# Patient Record
Sex: Male | Born: 1980 | Race: White | Hispanic: No | Marital: Married | Smoking: Never smoker
Health system: Southern US, Community
[De-identification: ages and names within clinical notes are randomized; demographics above are authoritative.]

## PROBLEM LIST (undated history)

## (undated) DIAGNOSIS — R202 Paresthesia of skin: Secondary | ICD-10-CM

## (undated) DIAGNOSIS — K3184 Gastroparesis: Secondary | ICD-10-CM

## (undated) DIAGNOSIS — K5901 Slow transit constipation: Secondary | ICD-10-CM

## (undated) DIAGNOSIS — E291 Testicular hypofunction: Secondary | ICD-10-CM

## (undated) DIAGNOSIS — G43009 Migraine without aura, not intractable, without status migrainosus: Secondary | ICD-10-CM

## (undated) HISTORY — DX: Gastroparesis: K31.84

## (undated) HISTORY — DX: Paresthesia of skin: R20.2

## (undated) HISTORY — DX: Migraine without aura, not intractable, without status migrainosus: G43.009

## (undated) HISTORY — DX: Testicular hypofunction: E29.1

## (undated) HISTORY — DX: Slow transit constipation: K59.01

---

## 2009-07-31 ENCOUNTER — Emergency Department (HOSPITAL_COMMUNITY): Admission: EM | Admit: 2009-07-31 | Discharge: 2009-07-31 | Payer: Self-pay | Admitting: Emergency Medicine

## 2009-09-06 ENCOUNTER — Emergency Department (HOSPITAL_COMMUNITY): Admission: EM | Admit: 2009-09-06 | Discharge: 2009-09-06 | Payer: Self-pay | Admitting: Emergency Medicine

## 2010-03-22 ENCOUNTER — Encounter: Admission: RE | Admit: 2010-03-22 | Discharge: 2010-03-22 | Payer: Self-pay | Admitting: Sports Medicine

## 2010-09-13 ENCOUNTER — Emergency Department (HOSPITAL_COMMUNITY)
Admission: EM | Admit: 2010-09-13 | Discharge: 2010-09-13 | Disposition: A | Discharge status: Home / Self Care | Payer: No Typology Code available for payment source | Attending: Emergency Medicine | Admitting: Emergency Medicine

## 2010-09-13 DIAGNOSIS — G8929 Other chronic pain: Secondary | ICD-10-CM | POA: Insufficient documentation

## 2010-09-13 DIAGNOSIS — R1032 Left lower quadrant pain: Secondary | ICD-10-CM | POA: Insufficient documentation

## 2010-09-13 DIAGNOSIS — K625 Hemorrhage of anus and rectum: Secondary | ICD-10-CM | POA: Insufficient documentation

## 2010-09-13 LAB — APTT: aPTT: 28 seconds (ref 24–37)

## 2010-09-13 LAB — COMPREHENSIVE METABOLIC PANEL
ALT: 19 U/L (ref 0–53)
Alkaline Phosphatase: 64 U/L (ref 39–117)
BUN: 15 mg/dL (ref 6–23)
CO2: 28 mEq/L (ref 19–32)
Calcium: 9.5 mg/dL (ref 8.4–10.5)
Chloride: 101 mEq/L (ref 96–112)
Creatinine, Ser: 0.95 mg/dL (ref 0.4–1.5)
GFR calc non Af Amer: >60 mL/min (ref >60)
Glucose, Bld: 128 mg/dL — ABNORMAL HIGH (ref 70–99)
Potassium: 3.5 mEq/L (ref 3.5–5.1)
Sodium: 139 mEq/L (ref 135–145)
Total Bilirubin: 0.7 mg/dL (ref 0.3–1.2)

## 2010-09-13 LAB — DIFFERENTIAL
Basophils Relative: 0 % (ref 0–1)
Eosinophils Absolute: 0 10*3/uL (ref 0.0–0.7)
Eosinophils Relative: 0 % (ref 0–5)
Lymphocytes Relative: 9 % — ABNORMAL LOW (ref 12–46)
Lymphs Abs: 0.9 10*3/uL (ref 0.7–4.0)
Monocytes Absolute: 0.6 10*3/uL (ref 0.1–1.0)
Monocytes Relative: 6 % (ref 3–12)
Neutro Abs: 8.1 10*3/uL — ABNORMAL HIGH (ref 1.7–7.7)
Neutrophils Relative %: 84 % — ABNORMAL HIGH (ref 43–77)

## 2010-09-13 LAB — URINALYSIS, ROUTINE W REFLEX MICROSCOPIC
Bilirubin Urine: NEGATIVE
Glucose, UA: NEGATIVE mg/dL
Ketones, ur: NEGATIVE mg/dL
Nitrite: NEGATIVE
Protein, ur: NEGATIVE mg/dL
Specific Gravity, Urine: 1.027 (ref 1.005–1.030)
Urobilinogen, UA: 0.2 mg/dL (ref 0.0–1.0)
pH: 6 (ref 5.0–8.0)

## 2010-09-13 LAB — PROTIME-INR
INR: 1.03 (ref 0.00–1.49)
Prothrombin Time: 13.7 seconds (ref 11.6–15.2)

## 2010-09-13 LAB — CBC
Hemoglobin: 14.5 g/dL (ref 13.0–17.0)
MCH: 31 pg (ref 26.0–34.0)
MCHC: 36.1 g/dL — ABNORMAL HIGH (ref 30.0–36.0)
MCV: 85.9 fL (ref 78.0–100.0)
Platelets: 251 10*3/uL (ref 150–400)
RBC: 4.68 MIL/uL (ref 4.22–5.81)
RDW: 11.9 % (ref 11.5–15.5)

## 2011-06-01 DIAGNOSIS — R1032 Left lower quadrant pain: Secondary | ICD-10-CM | POA: Insufficient documentation

## 2015-09-24 DIAGNOSIS — H6990 Unspecified Eustachian tube disorder, unspecified ear: Secondary | ICD-10-CM | POA: Insufficient documentation

## 2015-09-24 DIAGNOSIS — H6980 Other specified disorders of Eustachian tube, unspecified ear: Secondary | ICD-10-CM | POA: Insufficient documentation

## 2016-03-18 DIAGNOSIS — J300 Vasomotor rhinitis: Secondary | ICD-10-CM | POA: Insufficient documentation

## 2016-05-12 ENCOUNTER — Other Ambulatory Visit: Payer: Self-pay | Admitting: Gastroenterology

## 2016-05-12 DIAGNOSIS — R110 Nausea: Secondary | ICD-10-CM

## 2016-05-12 DIAGNOSIS — R1013 Epigastric pain: Secondary | ICD-10-CM

## 2016-06-04 ENCOUNTER — Encounter (HOSPITAL_COMMUNITY)
Admission: RE | Admit: 2016-06-04 | Discharge: 2016-06-04 | Disposition: A | Discharge status: Home / Self Care | Payer: BLUE CROSS/BLUE SHIELD | Source: Ambulatory Visit | Attending: Gastroenterology | Admitting: Gastroenterology

## 2016-06-04 DIAGNOSIS — R1013 Epigastric pain: Secondary | ICD-10-CM

## 2016-06-04 DIAGNOSIS — R110 Nausea: Secondary | ICD-10-CM | POA: Diagnosis present

## 2016-06-04 MED ORDER — TECHNETIUM TC 99M MEBROFENIN IV KIT
5.0000 | PACK | Freq: Once | INTRAVENOUS | Status: AC | PRN
  Administered 2016-06-04: 5 via INTRAVENOUS

## 2016-06-21 LAB — HM COLONOSCOPY

## 2016-07-06 ENCOUNTER — Other Ambulatory Visit: Payer: Self-pay | Admitting: Gastroenterology

## 2016-07-06 DIAGNOSIS — R634 Abnormal weight loss: Secondary | ICD-10-CM

## 2016-07-15 ENCOUNTER — Ambulatory Visit
Admission: RE | Admit: 2016-07-15 | Discharge: 2016-07-15 | Disposition: A | Discharge status: Home / Self Care | Payer: BLUE CROSS/BLUE SHIELD | Source: Ambulatory Visit | Attending: Gastroenterology | Admitting: Gastroenterology

## 2016-07-15 DIAGNOSIS — R634 Abnormal weight loss: Secondary | ICD-10-CM

## 2016-07-15 MED ORDER — IOPAMIDOL (ISOVUE-300) INJECTION 61%
100.0000 mL | Freq: Once | INTRAVENOUS | Status: AC | PRN
  Administered 2016-07-15: 100 mL via INTRAVENOUS

## 2016-10-11 DIAGNOSIS — R634 Abnormal weight loss: Secondary | ICD-10-CM | POA: Insufficient documentation

## 2017-02-09 DIAGNOSIS — R1011 Right upper quadrant pain: Secondary | ICD-10-CM | POA: Insufficient documentation

## 2017-03-14 DIAGNOSIS — R1084 Generalized abdominal pain: Secondary | ICD-10-CM | POA: Insufficient documentation

## 2017-03-21 DIAGNOSIS — K3184 Gastroparesis: Secondary | ICD-10-CM | POA: Insufficient documentation

## 2017-06-07 ENCOUNTER — Other Ambulatory Visit: Payer: Self-pay | Admitting: Gastroenterology

## 2017-06-07 DIAGNOSIS — R935 Abnormal findings on diagnostic imaging of other abdominal regions, including retroperitoneum: Secondary | ICD-10-CM

## 2017-08-17 IMAGING — CT CT ABD-PELV W/ CM
1 of 2 series · 15 of 32 positions shown, 19 images · IV contrast (APPLIED)
Comparison: 10/07/2008

CLINICAL DATA: Weight loss of 20 pounds in 3 months, no appetite
for 2 months, constipation, nausea, vomiting

EXAM:
CT ABDOMEN AND PELVIS WITH CONTRAST
TECHNIQUE: Multidetector CT imaging of the abdomen and pelvis was performed
using the standard protocol following bolus administration of
intravenous contrast. Sagittal and coronal MPR images reconstructed
from axial data set.
CONTRAST:  100mL [id number] IOPAMIDOL ([id number]) INJECTION 61% IV.
Dilute oral contrast.

[Series 2: abd/pelvis w/cm · axial · 0.67mm/px · z∈[+715,+1140]mm · 15 of 93 slices shown, 19 images]
[im 4/93  soft-tissue]
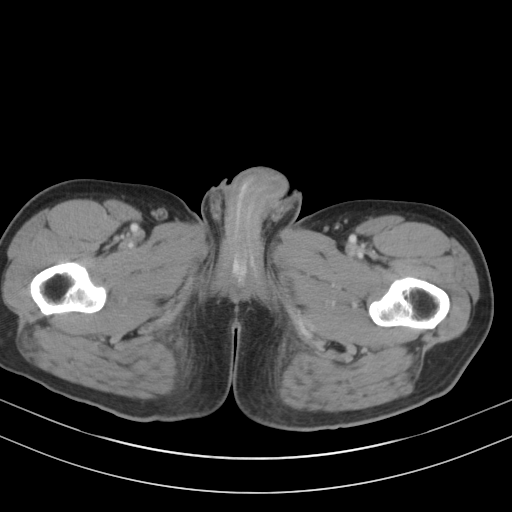
[im 4/93  bone]
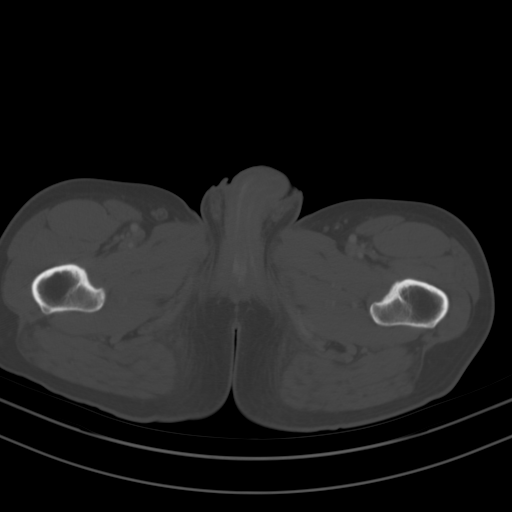
[im 12/93  soft-tissue]
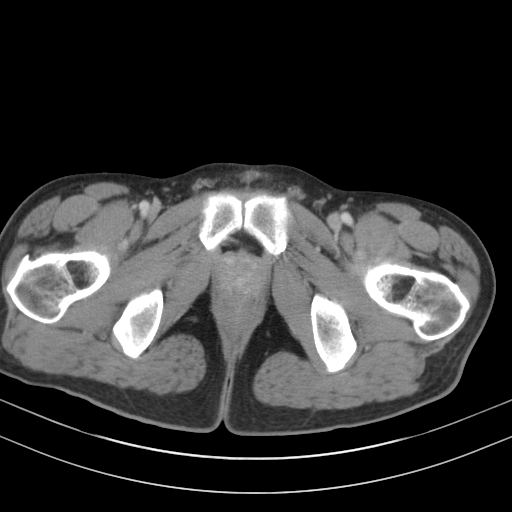
[im 20/93  soft-tissue]
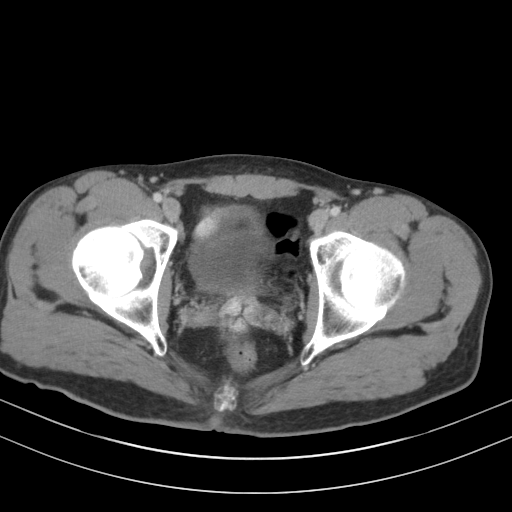
[im 27/93  soft-tissue]
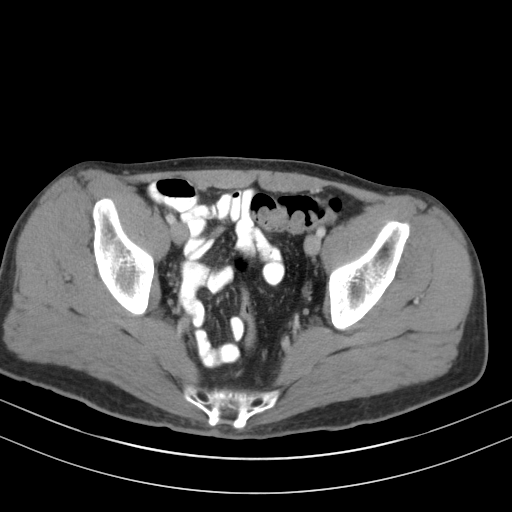
[im 31/93  soft-tissue]
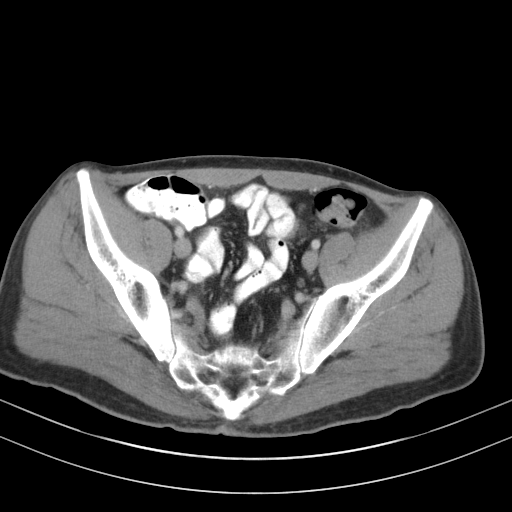
[im 39/93  soft-tissue]
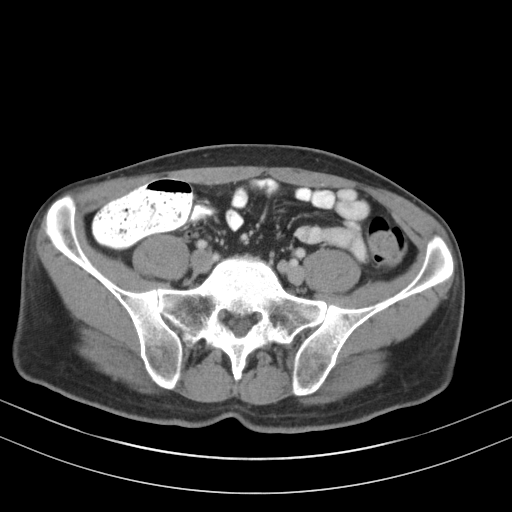
[im 47/93  soft-tissue]
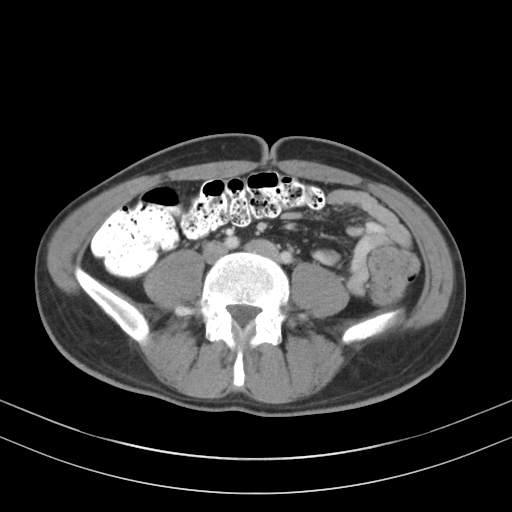
[im 54/93  soft-tissue]
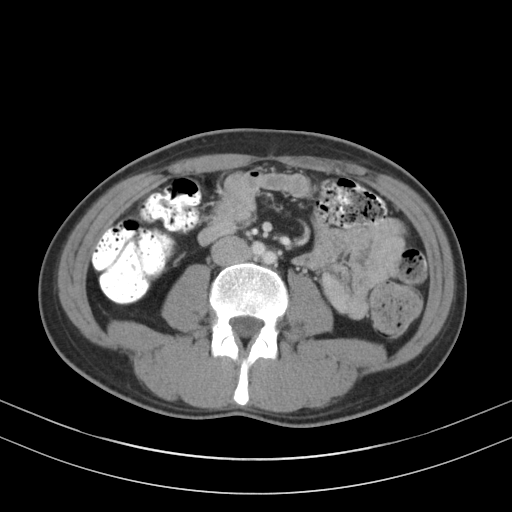
[im 62/93  soft-tissue]
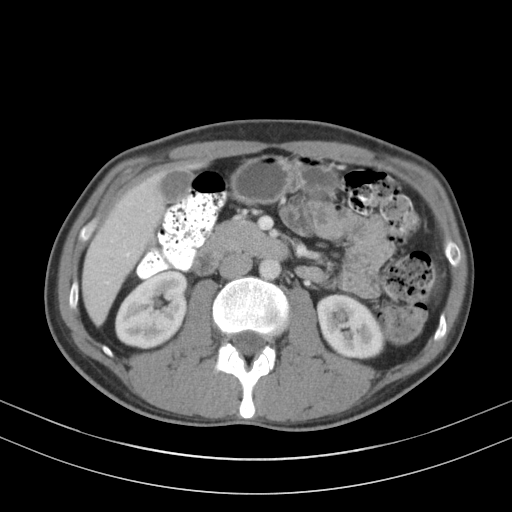
[im 62/93  bone]
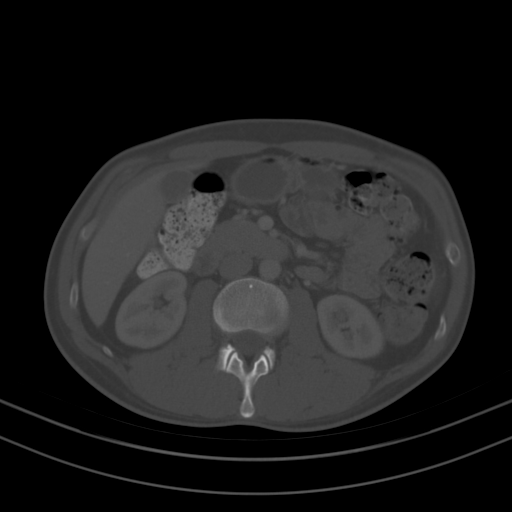
[im 66/93  soft-tissue]
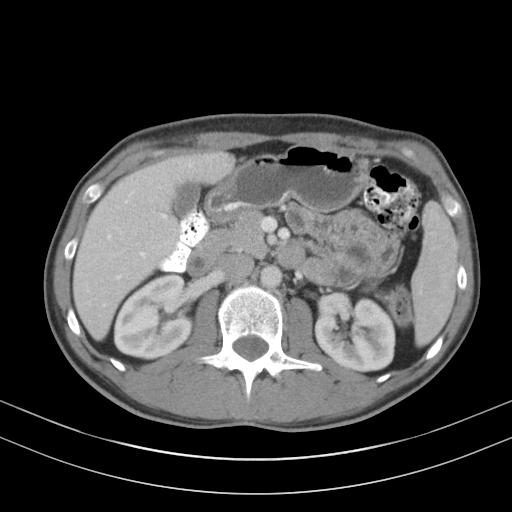
[im 73/93  soft-tissue]
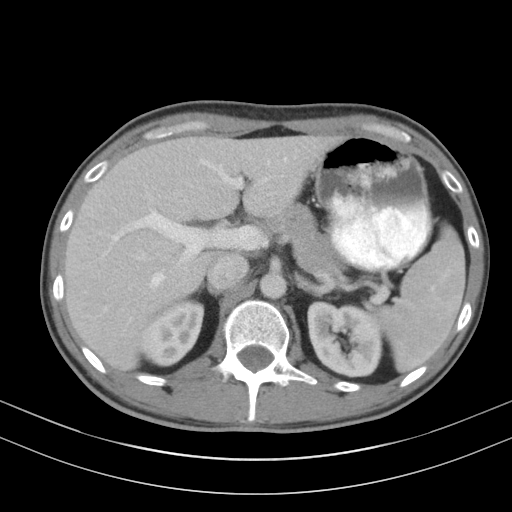
[im 77/93  lung]
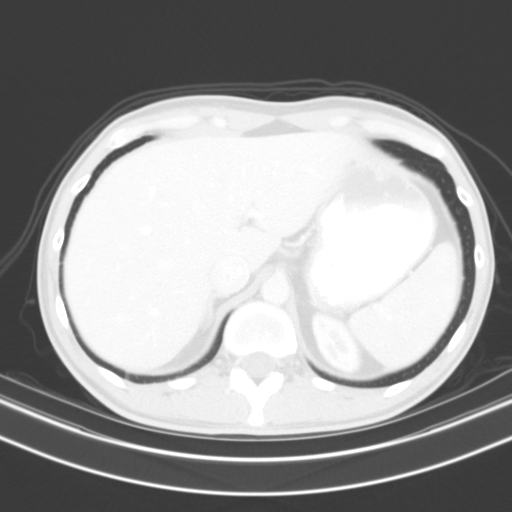
[im 81/93  soft-tissue]
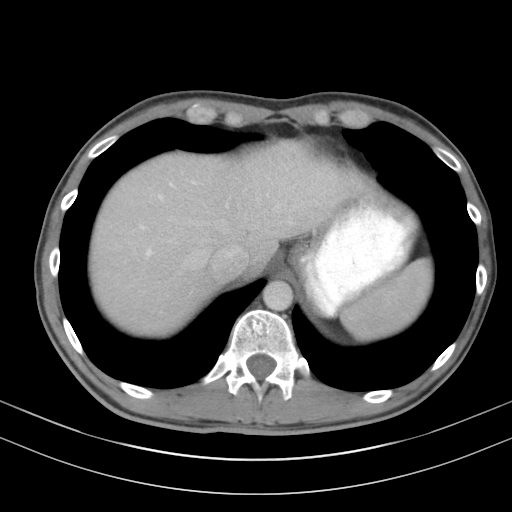
[im 81/93  lung]
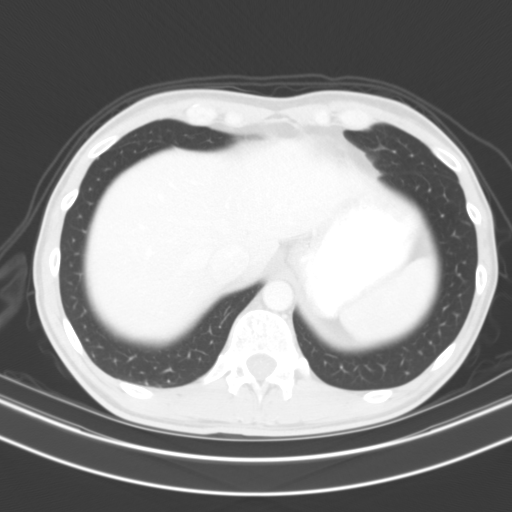
[im 85/93  lung]
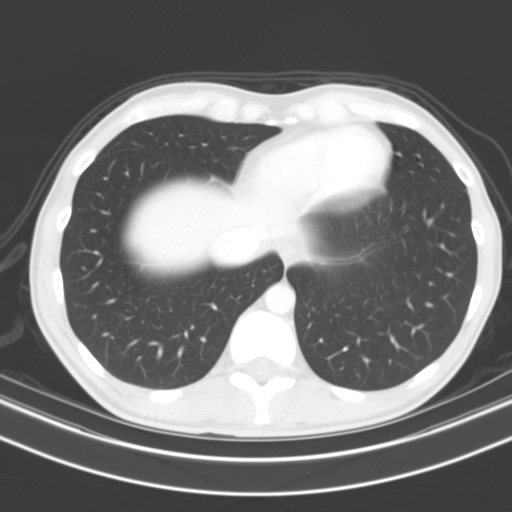
[im 89/93  soft-tissue]
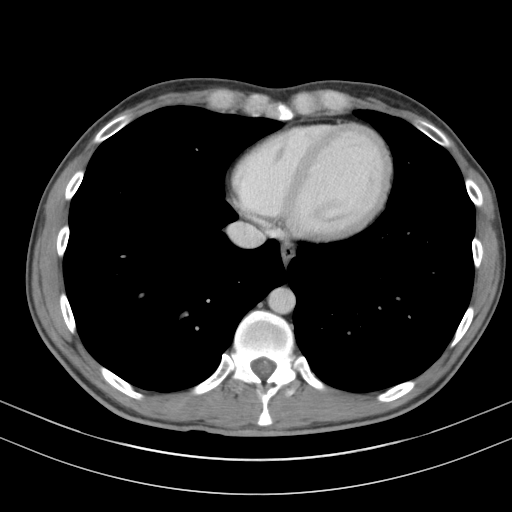
[im 89/93  lung]
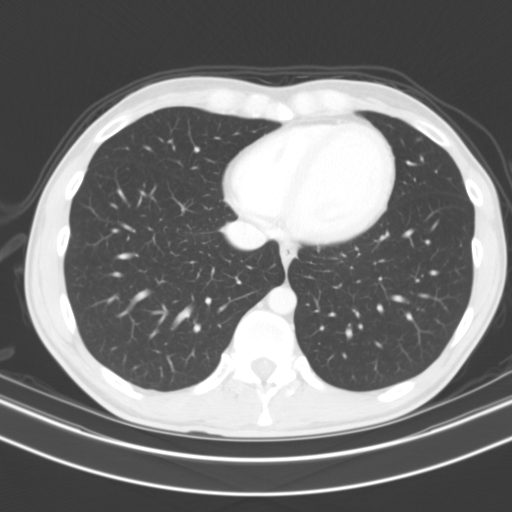

[15 of 32 positions shown; findings below may reference images not displayed]

FINDINGS: Lower chest: Clear lung bases

Hepatobiliary: Focal fatty infiltration of liver adjacent to
falciform fissure. Gallbladder and liver otherwise normal
appearance.

Pancreas: Normal appearance

Spleen: Normal appearance

Adrenals/Urinary Tract: Adrenal glands, kidneys, ureters, bladder,
and prostate gland normal appearance

Stomach/Bowel: Normal appendix. Stomach and bowel loops normal
appearance.

Vascular/Lymphatic: Vascular structures unremarkable. No adenopathy.

Reproductive: N/A

Other: No free air or free fluid. No hernia or inflammatory process.

Musculoskeletal: Normal appearance
IMPRESSION: No acute intra-abdominal or intrapelvic abnormalities.

## 2018-10-19 DIAGNOSIS — E291 Testicular hypofunction: Secondary | ICD-10-CM | POA: Insufficient documentation

## 2019-09-19 ENCOUNTER — Other Ambulatory Visit: Payer: Self-pay

## 2019-09-19 ENCOUNTER — Ambulatory Visit: Payer: BC Managed Care – PPO | Admitting: Nurse Practitioner

## 2019-09-19 ENCOUNTER — Encounter: Payer: Self-pay | Admitting: Nurse Practitioner

## 2019-09-19 VITALS — BP 132/70 | HR 92 | Temp 97.9°F | Ht 72.5 in | Wt 157.0 lb

## 2019-09-19 DIAGNOSIS — E291 Testicular hypofunction: Secondary | ICD-10-CM

## 2019-09-19 DIAGNOSIS — R5383 Other fatigue: Secondary | ICD-10-CM | POA: Insufficient documentation

## 2019-09-19 DIAGNOSIS — G43001 Migraine without aura, not intractable, with status migrainosus: Secondary | ICD-10-CM | POA: Diagnosis not present

## 2019-09-19 DIAGNOSIS — H6983 Other specified disorders of Eustachian tube, bilateral: Secondary | ICD-10-CM | POA: Diagnosis not present

## 2019-09-19 DIAGNOSIS — K219 Gastro-esophageal reflux disease without esophagitis: Secondary | ICD-10-CM | POA: Diagnosis not present

## 2019-09-19 MED ORDER — TOPIRAMATE 25 MG PO TABS: 25.0000 mg | ORAL_TABLET | Freq: Two times a day (BID) | ORAL | 0 refills | Status: DC

## 2019-09-19 NOTE — Patient Instructions (Addendum)
Migraine without aura and with status migrainosus, not intractable Migraine not well controlled, Ubrelvy samples given to patient for break through pain, Topamax started and Rx sent to pharmacy, provided education on stress management. Patient folow up in 4 weeks.  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 Recommendation is to follow up with specialist as scheduled.  Other fatigue New problem for patient , PGQ-9 done, pt is a 15  but refuses treatment at this time. Education provided that fatigue, low libido and weight gain can also be signs of depression. TSH labs collected.  Patient knows to follow up as scheduled.  Follow up 1 month started topamx   Fatigue If you have fatigue, you feel tired all the time and have a lack of energy or a lack of motivation. Fatigue may make it difficult to start or complete tasks because of exhaustion. In general, occasional or mild fatigue is often a normal response to activity or life. However, long-lasting (chronic) or extreme fatigue may be a symptom of a medical condition. Follow these instructions at home: General instructions  Watch your fatigue for any changes.  Go to bed and get up at the same time every day.  Avoid fatigue by pacing yourself during the day and getting enough sleep at night.  Maintain a healthy weight. Medicines  Take over-the-counter and prescription medicines only as told by your health care provider.  Take a multivitamin, if told by your health care provider.  Do not use herbal or dietary supplements unless they are approved by your health care provider. Activity   Exercise regularly, as told by your health care provider.  Use or practice techniques to help you relax, such as yoga, tai chi, meditation, or massage therapy. Eating and drinking   Avoid heavy meals in the evening.  Eat a well-balanced diet, which includes lean proteins, whole grains, plenty of  fruits and vegetables, and low-fat dairy products.  Avoid consuming too much caffeine.  Avoid the use of alcohol.  Drink enough fluid to keep your urine pale yellow. Lifestyle  Change situations that cause you stress. Try to keep your work and personal schedule in balance.  Do not use any products that contain nicotine or tobacco, such as cigarettes and e-cigarettes. If you need help quitting, ask your health care provider.  Do not use drugs. Contact a health care provider if:  Your fatigue does not get better.  You have a fever.  You suddenly lose or gain weight.  You have headaches.  You have trouble falling asleep or sleeping through the night.  You feel angry, guilty, anxious, or sad.  You are unable to have a bowel movement (constipation).  Your skin is dry.  You have swelling in your legs or another part of your body. Get help right away if:  You feel confused.  Your vision is blurry.  You feel faint or you pass out.  You have a severe headache.  You have severe pain in your abdomen, your back, or the area between your waist and hips (pelvis).  You have chest pain, shortness of breath, or an irregular or fast heartbeat.  You are unable to urinate, or you urinate less than normal.  You have abnormal bleeding, such as bleeding from the rectum, vagina, nose, lungs, or nipples.  You vomit blood.  You have thoughts about hurting yourself or others. If you ever feel like you may hurt yourself or others, or have thoughts about taking your  own life, get help right away. You can go to your nearest emergency department or call:  Your local emergency services (911 in the [place]  A suicide crisis helpline, such as the [place] at 719 292 1861. This is open 24 hours a day. Summary  If you have fatigue, you feel tired all the time and have a lack of energy or a lack of motivation.  Fatigue may make it difficult to start or complete  tasks because of exhaustion.  Long-lasting (chronic) or extreme fatigue may be a symptom of a medical condition.  Exercise regularly, as told by your health care provider.  Change situations that cause you stress. Try to keep your work and personal schedule in balance. This information is not intended to replace advice given to you by your health care provider. Make sure you discuss any questions you have with your health care provider. Document Revised: 12/27/2018 Document Reviewed: 03/02/2017 Elsevier Patient Education  [place] de reflujo gastroesofgico en los adultos Gastroesophageal Reflux Disease, Adult El reflujo gastroesofgico (RGE) ocurre cuando el cido del estmago sube por el tubo que conecta la boca con el estmago (esfago). Normalmente, la comida baja por el esfago y se mantiene en el estmago, donde se la digiere. Cuando una persona tiene RGE, los alimentos y el cido estomacal suelen volver al esfago. Usted puede tener una enfermedad llamada enfermedad de reflujo gastroesofgico (ERGE) si el reflujo:  Sucede a menudo.  Causa sntomas frecuentes o muy intensos.  Causa problemas tales como dao en el esfago. Cuando esto ocurre, el esfago duele y se hincha (inflama). Con el tiempo, la ERGE puede ocasionar pequeos agujeros (lceras) en el revestimiento del esfago. Cules son las causas? Esta afeccin se debe a un problema en el msculo que se encuentra entre el esfago y [place]. Cuando este msculo est dbil o no es normal, no se cierra correctamente para impedir que los alimentos y el cido regresen del Paramedic. El msculo puede debilitarse debido a lo siguiente:  El consumo de [place].  [place].  Tener cierto tipo de hernia (hernia de hiato).  Consumo de alcohol.  Ciertos alimentos y bebidas, como caf, chocolate, cebollas y [place]. Qu incrementa el riesgo? Es ms probable que tenga esta afeccin si:  Tiene sobrepeso.  Tiene  una enfermedad que afecta el tejido conjuntivo.  [place] antiinflamatorios no esteroideos (AINE). Cules son los signos o los sntomas? Los sntomas de esta afeccin incluyen:  Acidez estomacal.  Dificultad o dolor al tragar.  Sensacin de Best boy un bulto en la garganta.  Sabor amargo en la boca.  Mal aliento.  Tener una gran cantidad de saliva.  Estmago inflamado o con Tree surgeon.  Eructos.  Dolor en el pecho. El dolor de pecho puede deberse a distintas afecciones. Asegrese de Teacher, adult education a su mdico si tiene Tourist information centre manager.  Falta de aire o respiracin ruidosa (sibilancias).  Tos constante (crnica) o durante la noche.  Desgaste de la superficie de los dientes (esmalte dental).  Prdida de peso. Cmo se trata? El tratamiento depender de la gravedad de los sntomas. El mdico puede sugerirle lo siguiente:  Cambios en la dieta.  Medicamentos.  [place]. Siga estas indicaciones en su casa: Comida y bebida   Siga una dieta como se lo haya indicado el mdico. Es posible que deba evitar alimentos y bebidas, por ejemplo: ? Caf y t (con o sin cafena). ? Bebidas que contengan alcohol. ? Bebidas energticas y deportivas. ? Oswaldo Conroy y  refrescos. ? Chocolate y cacao. ? Menta y [place]. ? Ajo y cebolla. ? Rbano picante. ? Alimentos cidos y condimentados. Estos incluyen todos los tipos de pimientos, [place] en polvo, curry en polvo, vinagre, salsas picantes y [employer]. ? Ctricos y sus jugos, por ejemplo, naranjas, limones y limas. ? Alimentos que [employer]. Estos incluyen salsa roja, [place], salsa picante y pizza con salsa de [place]. ? Alimentos fritos y Radio broadcast assistant. Estos incluyen donas, papas fritas, papitas fritas de bolsa y aderezos con alto contenido de [place]. ? Carnes con alto contenido de [place]. Estas incluye los perros calientes, chuletas o costillas, embutidos, jamn y tocino. ? Productos lcteos ricos en grasas, como leche [place],  [place] y [place] crema.  Consuma pequeas cantidades de comida con ms frecuencia. Evite consumir porciones abundantes.  Evite beber grandes cantidades de lquidos con las comidas.  Evite comer 2 o 3horas antes de acostarse.  Evite recostarse inmediatamente despus de comer.  No haga ejercicios enseguida despus de comer. Estilo de vida   No consuma ningn producto que contenga nicotina o tabaco. Estos incluyen cigarrillos, cigarrillos electrnicos y tabaco para Higher education careers adviser. Si necesita ayuda para dejar de fumar, consulte al [employer].  Intente reducir [employer] de estrs. Si necesita ayuda para hacer esto, consulte al mdico.  Si tiene sobrepeso, baje una cantidad de peso saludable para usted. Consulte a su mdico para bajar de peso de [employer]. Indicaciones generales  Est atento a cualquier cambio en los sntomas.  Tome los medicamentos de venta libre y los recetados solamente como se lo haya indicado el mdico. No tome aspirina, ibuprofeno ni otros AINE a menos que el mdico lo autorice.  Use ropa holgada. No use nada apretado alrededor de la cintura.  Levante (eleve) la cabecera de la cama aproximadamente 6pulgadas (15cm).  Evite inclinarse si al hacerlo empeoran los sntomas.  Concurra a todas las visitas de seguimiento como se lo haya indicado el mdico. Esto es importante. Comunquese con un mdico si:  Aparecen nuevos sntomas.  Adelgaza y no sabe por qu.  Tiene problemas para tragar o le duele cuando traga.  Tiene sibilancias o tos persistente.  Los sntomas no mejoran con Dispensing optician.  Tiene la voz ronca. Solicite ayuda inmediatamente si:  [employer], el cuello, la [place], los dientes o la espalda.  Se siente transpirado, mareado o tiene una sensacin de desvanecimiento.  Siente falta de aire o Tourist information centre manager.  Vomita y el vmito tiene un aspecto similar a la sangre o a los posos de caf.  Pierde el conocimiento (se  desmaya).  Las deposiciones (heces) son sanguinolentas o negras.  No puede tragar, beber o comer. Resumen  Si una persona tiene enfermedad de reflujo gastroesofgico (ERGE), los alimentos y el cido estomacal suben al esfago y causan sntomas o problemas tales como dao en el esfago.  El tratamiento depender de la gravedad de los sntomas.  Siga una Lexicographer se lo haya indicado el [place] todos los medicamentos solamente como se lo haya indicado el mdico. Esta informacin no tiene Marine scientist el consejo del mdico. Asegrese de hacerle al mdico cualquier pregunta que tenga. Document Revised: 01/19/2018 Document Reviewed: 01/19/2018 Elsevier Patient Education  [place]. Migraine Headache A migraine headache is a very strong throbbing pain on one side or both sides of your head. This type of headache can also cause other symptoms. It can last from 4 hours to 3 days. Talk with  your doctor about what things may bring on (trigger) this condition. What are the causes? The exact cause of this condition is not known. This condition may be triggered or caused by:  Drinking alcohol.  Smoking.  Taking medicines, such as: ? Medicine used to treat chest pain (nitroglycerin). ? Birth control pills. ? Estrogen. ? Some blood pressure medicines.  Eating or drinking certain products.  Doing physical activity. Other things that may trigger a migraine headache include:  Having a menstrual period.  Pregnancy.  Hunger.  Stress.  Not getting enough sleep or getting too much sleep.  Weather changes.  Tiredness (fatigue). What increases the risk?  Being 79-31 years old.  Being male.  Having a family history of migraine headaches.  Being Caucasian.  Having depression or anxiety.  Being very overweight. What are the signs or symptoms?  A throbbing pain. This pain may: ? Happen in any area of the head, such as on one side or both sides. ? Make  it hard to do daily activities. ? Get worse with physical activity. ? Get worse around bright lights or loud noises.  Other symptoms may include: ? Feeling sick to your stomach (nauseous). ? Vomiting. ? Dizziness. ? Being sensitive to bright lights, loud noises, or smells.  Before you get a migraine headache, you may get warning signs (an aura). An aura may include: ? Seeing flashing lights or having blind spots. ? Seeing bright spots, halos, or zigzag lines. ? Having tunnel vision or blurred vision. ? Having numbness or a tingling feeling. ? Having trouble talking. ? Having weak muscles.  Some people have symptoms after a migraine headache (postdromal phase), such as: ? Tiredness. ? Trouble thinking (concentrating). How is this treated?  Taking medicines that: ? Relieve pain. ? Relieve the feeling of being sick to your stomach. ? Prevent migraine headaches.  Treatment may also include: ? Having acupuncture. ? Avoiding foods that bring on migraine headaches. ? Learning ways to control your body functions (biofeedback). ? Therapy to help you know and deal with negative thoughts (cognitive behavioral therapy). Follow these instructions at home: Medicines  Take over-the-counter and prescription medicines only as told by your doctor.  Ask your doctor if the medicine prescribed to you: ? Requires you to avoid driving or using heavy machinery. ? Can cause trouble pooping (constipation). You may need to take these steps to prevent or treat trouble pooping:  Drink enough fluid to keep your pee (urine) pale yellow.  Take over-the-counter or prescription medicines.  Eat foods that are high in fiber. These include beans, whole grains, and fresh fruits and vegetables.  Limit foods that are high in fat and sugar. These include fried or sweet foods. Lifestyle  Do not drink alcohol.  Do not use any products that contain nicotine or tobacco, such as cigarettes, e-cigarettes, and  chewing tobacco. If you need help quitting, ask your doctor.  Get at least 8 hours of sleep every night.  Limit and deal with stress. General instructions      Keep a journal to find out what may bring on your migraine headaches. For example, write down: ? What you eat and drink. ? How much sleep you get. ? Any change in what you eat or drink. ? Any change in your medicines.  If you have a migraine headache: ? Avoid things that make your symptoms worse, such as bright lights. ? It may help to lie down in a dark, quiet room. ? Do not drive  or use heavy machinery. ? Ask your doctor what activities are safe for you.  Keep all follow-up visits as told by your doctor. This is important. Contact a doctor if:  You get a migraine headache that is different or worse than others you have had.  You have more than 15 headache days in one month. Get help right away if:  Your migraine headache gets very bad.  Your migraine headache lasts longer than 72 hours.  You have a fever.  You have a stiff neck.  You have trouble seeing.  Your muscles feel weak or like you cannot control them.  You start to lose your balance a lot.  You start to have trouble walking.  You pass out (faint).  You have a seizure. Summary  A migraine headache is a very strong throbbing pain on one side or both sides of your head. These headaches can also cause other symptoms.  This condition may be treated with medicines and changes to your lifestyle.  Keep a journal to find out what may bring on your migraine headaches.  Contact a doctor if you get a migraine headache that is different or worse than others you have had.  Contact your doctor if you have more than 15 headache days in a month. This information is not intended to replace advice given to you by your health care provider. Make sure you discuss any questions you have with your health care provider. Document Revised: 09/29/2018 Document  Reviewed: 07/20/2018 Elsevier Patient Education  [place].  Depression Screening Depression screening is a tool that your health care provider can use to learn if you have symptoms of depression. Depression is a common condition with many symptoms that are also often found in other conditions. Depression is treatable, but it must first be diagnosed. You may not know that certain feelings, thoughts, and behaviors that you are having can be symptoms of depression. Taking a depression screening test can help you and your health care provider decide if you need more assessment, or if you should be referred to a mental health care provider. What are the screening tests?  You may have a physical exam to see if another condition is affecting your mental health. You may have a blood or urine sample taken during the physical exam.  You may be interviewed using a screening tool that was developed from research, such as one of these: ? Patient Health Questionnaire (PHQ). This is a set of either 2 or 9 questions. A health care provider who has been trained to score this screening test uses a guide to assess if your symptoms suggest that you may have depression. ? Hamilton Depression Rating Scale (HAM-D). This is a set of either 17 or 24 questions. You may be asked to take it again during or after your treatment, to see if your depression has gotten better. ? Beck Depression Inventory (BDI). This is a set of 21 multiple choice questions. Your health care provider scores your answers to assess:  Your level of depression, ranging from mild to severe.  Your response to treatment.  Your health care provider may talk with you about your daily activities, such as eating, sleeping, work, and recreation, and ask if you have had any changes in activity.  Your health care provider may ask you to see a mental health specialist, such as a psychiatrist or psychologist, for more evaluation. Who should be  screened for depression?   All adults, including adults with  a family history of a mental health disorder.  Adolescents who are 6-35 years old.  People who are recovering from a myocardial infarction (MI).  Pregnant women, or women who have given birth.  People who have a long-term (chronic) illness.  Anyone who has been diagnosed with another type of a mental health disorder.  Anyone who has symptoms that could show depression. What do my results mean? Your health care provider will review the results of your depression screening, physical exam, and lab tests. Positive screens suggest that you may have depression. Screening is the first step in getting the care that you may need. It is up to you to get your screening results. Ask your health care provider, or the department that is doing your screening tests, when your results will be ready. Talk with your health care provider about your results and diagnosis. A diagnosis of depression is made using the Diagnostic and Statistical Manual of Mental Disorders (DSM-V). This is a book that lists the number and type of symptoms that must be present for a health care provider to give a specific diagnosis.  Your health care provider may work with you to treat your symptoms of depression, or your health care provider may help you find a mental health provider who can assess, diagnose, and treat your depression. Get help right away if:  You have thoughts about hurting yourself or others. If you ever feel like you may hurt yourself or others, or have thoughts about taking your own life, get help right away. You can go to your nearest emergency department or call:  Your local emergency services (911 in the [place]  A suicide crisis helpline, such as the [place] at (615) 340-2869. This is open 24 hours a day. Summary  Depression screening is the first step in getting the help that you may need.  If your screening test  shows symptoms of depression (is positive), your health care provider may ask you to see a mental health provider.  Anyone who is age 42 or older should be screened for depression. This information is not intended to replace advice given to you by your health care provider. Make sure you discuss any questions you have with your health care provider. Document Revised: 05/20/2017 Document Reviewed: 10/22/2016 Elsevier Patient Education  [place].

## 2019-09-19 NOTE — Assessment & Plan Note (Addendum)
This is a new problem for patient , PHQ-9 completed, patient has a score of 16  but refuses treatment at this time. Education provided that fatigue, low libido and weight gain can also be signs of depression. TSH labs collected.  Patient knows to follow up as scheduled.

## 2019-09-19 NOTE — Progress Notes (Addendum)
Established Patient Office Visit  Subjective:  Patient ID: Charles Weber, male    DOB: Sep 07, 1980  Age: 39 y.o. MRN: [id number]  CC: Patient  is a 39 year  old  male. He is here for Migraine, testicular hypofunction and fatigue The patient's medications were reviewed and reconciled since the patient's last visit.  History details were provided by the patient. The history appears to be reliable.     Chief Complaint  Patient presents with  . Generalized Body Aches    HPI Charles Weber presents for Migraine  This is a chronic problem. The current episode started more than 1 year ago. The problem occurs daily. The problem has been unchanged. The pain is located in the bilateral region. The pain quality is similar to prior headaches. The quality of the pain is described as aching. The pain is at a severity of 7/10. The pain is severe. Associated symptoms include abdominal pain and nausea. Pertinent negatives include no blurred vision, fever, loss of balance, tinnitus or visual change. Nothing aggravates the symptoms. He has tried NSAIDs (CGRP (UBRELVY)) for the symptoms. The treatment provided mild relief. His past medical history is significant for migraine headaches.   Concerning patients testicular hypofunction, patient is reporting low libido even with increase in testosterone replacement. [name] is reporting, increase weight gain, breast enlargement, aggressive behavior toward his son, fatigue and generalize body ache.       Past Medical History:  Diagnosis Date  . Gastro-esophageal reflux disease without esophagitis   . Gastroparesis   . Migraine without aura, not intractable, without status migrainosus   . Paresthesia of skin   . Slow transit constipation   . Testicular hypofunction     History reviewed. No pertinent surgical history.  Family History  Problem Relation Age of Onset  . Irritable bowel syndrome Mother   . Heart attack Father   . Breast cancer Maternal Grandmother      Social History   Socioeconomic History  . Marital status: Married    Spouse name: Not on file  . Number of children: 1  . Years of education: Not on file  . Highest education level: Not on file  Occupational History  . Not on file  Tobacco Use  . Smoking status: Never Smoker  . Smokeless tobacco: Never Used  Substance and Sexual Activity  . Alcohol use: Never  . Drug use: Never  . Sexual activity: Not on file  Other Topics Concern  . Not on file  Social History Narrative  . Not on file   Social Determinants of Health   Financial Resource Strain:   . Difficulty of Paying Living Expenses:   Food Insecurity:   . Worried About Charity fundraiser in the Last Year:   . Arboriculturist in the Last Year:   Transportation Needs:   . Film/video editor (Medical):   Marland Kitchen Lack of Transportation (Non-Medical):   Physical Activity:   . Days of Exercise per Week:   . Minutes of Exercise per Session:   Stress:   . Feeling of Stress :   Social Connections:   . Frequency of Communication with Friends and Family:   . Frequency of Social Gatherings with Friends and Family:   . Attends Religious Services:   . Active Member of Clubs or Organizations:   . Attends Archivist Meetings:   Marland Kitchen Marital Status:   Intimate Partner Violence:   . Fear of Current or Ex-Partner:   .  Emotionally Abused:   Marland Kitchen Physically Abused:   . Sexually Abused:     Outpatient Medications Prior to Visit  Medication Sig Dispense Refill  . aspirin-acetaminophen-caffeine (EXCEDRIN MIGRAINE) 250-250-65 MG tablet Take by mouth.    . DEXILANT 60 MG capsule Take 1 capsule by mouth daily.    . Testosterone 20.25 MG/ACT (1.62%) GEL Apply 40.5 mg topically daily.     No facility-administered medications prior to visit.    Allergies  Allergen Reactions  . Penicillins Hives  . Cashew Nut Oil Hives  . Levetiracetam Other (See Comments)     Office Visit from 09/19/2019 in [facility]  PHQ-9  Total Score  16     ROS Review of Systems  Constitutional: Positive for activity change and fatigue. Negative for appetite change, fever and unexpected weight change.  HENT: Negative for tinnitus.   Eyes: Negative for blurred vision.  Respiratory: Negative for chest tightness and shortness of breath.   Cardiovascular: Negative for chest pain and palpitations.  Gastrointestinal: Positive for abdominal pain and nausea. Negative for diarrhea.  Endocrine: Negative for cold intolerance and heat intolerance.  Genitourinary: Negative for difficulty urinating.  Musculoskeletal: Positive for arthralgias and myalgias.  Skin: Negative for rash.  Neurological: Negative for facial asymmetry and loss of balance.  Psychiatric/Behavioral: The patient is nervous/anxious.       Objective:    Physical Exam  Constitutional: He is oriented to person, place, and time. He appears well-developed and well-nourished. He appears distressed.  HENT:  Head: Normocephalic.  Mouth/Throat: Oropharynx is clear and moist.  Eyes: Conjunctivae are normal.  Cardiovascular: Normal rate, regular rhythm and normal heart sounds.  Pulmonary/Chest: Effort normal and breath sounds normal.  Abdominal: Soft. Bowel sounds are normal.  Musculoskeletal:        General: Tenderness present.     Cervical back: Neck supple.  Neurological: He is alert and oriented to person, place, and time.  Skin: Skin is warm. No rash noted.  Psychiatric: He has a normal mood and affect.     Office Visit from 09/19/2019 in [facility]  PHQ-9 Total Score  16      BP 132/70 (BP Location: Left Arm, Patient Position: Sitting)   Pulse 92   Temp 97.9 F (36.6 C) (Temporal)   Ht 6' 0.5" (1.842 m)   Wt 157 lb (71.2 kg)   SpO2 99%   BMI 21.00 kg/m  Wt Readings from Last 3 Encounters:  09/19/19 157 lb (71.2 kg)     Health Maintenance Due  Topic Date Due  . HIV Screening  Never done    There are no preventive care reminders to  display for this patient.  Lab Results  Component Value Date   TSH 3.020 09/19/2019   Lab Results  Component Value Date   WBC 9.7 09/13/2010   HGB 14.5 09/13/2010   HCT 40.2 09/13/2010   MCV 85.9 09/13/2010   PLT 251 09/13/2010   Lab Results  Component Value Date   NA 139 09/13/2010   K 3.5 09/13/2010   CO2 28 09/13/2010   GLUCOSE 128 (H) 09/13/2010   BUN 15 09/13/2010   CREATININE 0.95 09/13/2010   BILITOT 0.7 09/13/2010   ALKPHOS 64 09/13/2010   AST 22 09/13/2010   ALT 19 09/13/2010   PROT 7.2 09/13/2010   ALBUMIN 4.6 09/13/2010   CALCIUM 9.5 09/13/2010   No results found for: CHOL No results found for: HDL No results found for: LDLCALC No results found  for: TRIG No results found for: CHOLHDL No results found for: TOIZ1I    Assessment & Plan:  Migraine without aura and with status migrainosus, not intractable Migraine not well controlled, Ubrelvy samples given to patient for break through pain, Topamax started and Rx sent to pharmacy, provided education on stress management. Patient folow up in 4 weeks.  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 Recommendation is to follow up with specialist as scheduled.  Other fatigue This is a new problem for patient , PHQ-9 completed, patient has a score of 16  but refuses treatment at this time. Education provided that fatigue, low libido and weight gain can also be signs of depression. TSH labs collected.  Patient knows to follow up as scheduled.  Problem List Items Addressed This Visit      Cardiovascular and Mediastinum   Migraine without aura and with status migrainosus, not intractable - Primary    Migraine not well controlled, Ubrelvy samples given to patient for break through pain, Topamax started and Rx sent to pharmacy, provided education on stress management. Patient folow up in 4 weeks.      Relevant Medications   aspirin-acetaminophen-caffeine (EXCEDRIN  MIGRAINE) 250-250-65 MG tablet   topiramate (TOPAMAX) 25 MG tablet     Digestive   Gastroesophageal reflux disease without esophagitis   Relevant Medications   DEXILANT 60 MG capsule     Endocrine   Male hypogonadism    Not well controlled.Pt is reporting side effects from testosterone therapy. Recommendation is to follow up with specialist as scheduled.        Nervous and Auditory   RESOLVED: Eustachian tube dysfunction     Other   Other fatigue    This is a new problem for patient , PHQ-9 completed, patient has a score of 16  but refuses treatment at this time. Education provided that fatigue, low libido and weight gain can also be signs of depression. TSH labs collected.  Patient knows to follow up as scheduled.      Relevant Orders   TSH (Completed)      Meds ordered this encounter  Medications  . DISCONTD: topiramate (TOPAMAX) 25 MG tablet    Sig: Take 1 tablet (25 mg total) by mouth 2 (two) times daily.    Dispense:  60 tablet    Refill:  0    Order Specific Question:   Supervising Provider    AnswerBlane Ohara [id number]  . topiramate (TOPAMAX) 25 MG tablet    Sig: Take 1 tablet (25 mg total) by mouth 2 (two) times daily. Week 1: 25 mg by mouth Hour of sleep, week 2: 25 mg po twice a day, week 3: 25 mg by mouth morning and 50 mg hour of sleep, Week 4: 50 mg po twice a day.    Dispense:  70 tablet    Refill:  0    Order Specific Question:   Supervising Provider    Answer:   Corey Harold    Follow-up: Return in about 1 month (around 10/19/2019) for follow up Topamax.  I spent an approximate 35 minute with patient  Daryll Drown, NP

## 2019-09-19 NOTE — Assessment & Plan Note (Signed)
Migraine not well controlled, Ubrelvy samples given to patient for break through pain, Topamax started and Rx sent to pharmacy, provided education on stress management. Patient folow up in 4 weeks.

## 2019-09-19 NOTE — Assessment & Plan Note (Signed)
Not well controlled.Pt is reporting side effects from testosterone therapy. Recommendation is to follow up with specialist as scheduled.

## 2019-09-20 LAB — TSH: TSH: 3.02 u[IU]/mL (ref 0.450–4.500)

## 2019-10-16 ENCOUNTER — Other Ambulatory Visit: Payer: Self-pay

## 2019-10-16 MED ORDER — DEXILANT 60 MG PO CPDR: 1.0000 | DELAYED_RELEASE_CAPSULE | Freq: Every day | ORAL | 1 refills | Status: DC

## 2019-10-22 ENCOUNTER — Ambulatory Visit: Payer: BC Managed Care – PPO | Admitting: Legal Medicine

## 2019-10-22 ENCOUNTER — Encounter: Payer: Self-pay | Admitting: Legal Medicine

## 2019-10-22 ENCOUNTER — Ambulatory Visit: Payer: BC Managed Care – PPO | Admitting: Nurse Practitioner

## 2019-10-22 ENCOUNTER — Other Ambulatory Visit: Payer: Self-pay

## 2019-10-22 VITALS — BP 120/67 | HR 70 | Temp 98.0°F | Resp 16 | Ht 73.0 in | Wt 157.0 lb

## 2019-10-22 DIAGNOSIS — M79602 Pain in left arm: Secondary | ICD-10-CM

## 2019-10-22 DIAGNOSIS — R739 Hyperglycemia, unspecified: Secondary | ICD-10-CM

## 2019-10-22 DIAGNOSIS — M79601 Pain in right arm: Secondary | ICD-10-CM

## 2019-10-22 DIAGNOSIS — F321 Major depressive disorder, single episode, moderate: Secondary | ICD-10-CM | POA: Diagnosis not present

## 2019-10-22 DIAGNOSIS — R202 Paresthesia of skin: Secondary | ICD-10-CM | POA: Diagnosis not present

## 2019-10-22 DIAGNOSIS — E291 Testicular hypofunction: Secondary | ICD-10-CM | POA: Diagnosis not present

## 2019-10-22 DIAGNOSIS — M79642 Pain in left hand: Secondary | ICD-10-CM

## 2019-10-22 DIAGNOSIS — K219 Gastro-esophageal reflux disease without esophagitis: Secondary | ICD-10-CM

## 2019-10-22 DIAGNOSIS — M25541 Pain in joints of right hand: Secondary | ICD-10-CM

## 2019-10-22 HISTORY — DX: Pain in joints of right hand: M25.541

## 2019-10-22 HISTORY — DX: Pain in left hand: M79.642

## 2019-10-22 MED ORDER — DEXILANT 60 MG PO CPDR: 1.0000 | DELAYED_RELEASE_CAPSULE | Freq: Every day | ORAL | 2 refills | Status: DC

## 2019-10-22 MED ORDER — FLUOXETINE HCL 20 MG PO TABS: 20.0000 mg | ORAL_TABLET | Freq: Every day | ORAL | 3 refills | Status: DC

## 2019-10-22 MED ORDER — ZOLPIDEM TARTRATE 10 MG PO TABS: 10.0000 mg | ORAL_TABLET | Freq: Every evening | ORAL | 1 refills | Status: AC | PRN

## 2019-10-22 NOTE — Assessment & Plan Note (Signed)
Plan of care was formulated today.  he is doing well.  A plan of care was formulated using patient exam, tests and other sources to optimize care using evidence based information.  Recommend no smoking, no eating after supper, avoid fatty foods, elevate Head of bed, avoid tight fitting clothing.  Continue on dexalant.

## 2019-10-22 NOTE — Assessment & Plan Note (Signed)
Patient has bilateral shoulder muscle tension and paresthesias both arms.  I ordered NCS/EMG

## 2019-10-22 NOTE — Assessment & Plan Note (Signed)
Patient's depression is uncontrolled with no medicines.   Anhedonia worse.  PHQ 9 was performed score 16. An individual care plan was established or reinforced today.  The patient's disease status was assessed using clinical findings on exam, labs, and or other diagnostic testing to determine patient's success in meeting treatment goals based on disease specific evidence-based guidelines and found to be worsening Recommendations include start on fluoxetine 20mg  and ambien at night for sleep.

## 2019-10-22 NOTE — Assessment & Plan Note (Signed)
Patient is having elevated bs WITH Paresthesias in arm, Check A1c.

## 2019-10-22 NOTE — Progress Notes (Signed)
Established Patient Office Visit  Subjective:  Patient ID: Charles Weber, male    DOB: [birth year]  Age: 39 y.o. MRN: [id number]  CC:  Chief Complaint  Patient presents with  . Peripheral Neuropathy    Burning his skin and muscles on his arms and legs  . Testicles Burning    Since he started testosterone.    HPI Charles Weber presents for Chronic visit.  Peripheral neuropathy. Numbness in legs, muscle burning in arms.  He is aching and tense in shoulders.  No sleeping well.  Sleeps for 4 hours.  He is tired all the time. He has seen headache center no help.  Hypogonadism He is seeing Dr. Allena Katz for hypogonadism.  Feels worse on Testosterone gel.  This patient has situational for 1 year.  PHQ9 =16.  Patient is having less anhedonia.  The patient has less future plans and prospects.  The depression is worse with stress.  The patient is not exercising and working on behavior to improve mental health.  Patient is not seeing a therapist or psychiatrist.  none  Patient is on none. PHQ9 SCORE ONLY 09/19/2019  Score 16    Past Medical History:  Diagnosis Date  . Gastroparesis   . Migraine without aura, not intractable, without status migrainosus   . Pain in joints of right hand 10/22/2019  . Pain in left hand 10/22/2019  . Paresthesia of skin   . Slow transit constipation   . Testicular hypofunction     History reviewed. No pertinent surgical history.  Family History  Problem Relation Age of Onset  . Irritable bowel syndrome Mother   . Heart attack Father   . Breast cancer Maternal Grandmother     Social History   Socioeconomic History  . Marital status: Married    Spouse name: Not on file  . Number of children: 1  . Years of education: Not on file  . Highest education level: Not on file  Occupational History  . Not on file  Tobacco Use  . Smoking status: Never Smoker  . Smokeless tobacco: Never Used  Substance and Sexual Activity  . Alcohol use: Never  . Drug use:  Never  . Sexual activity: Yes  Other Topics Concern  . Not on file  Social History Narrative  . Not on file   Social Determinants of Health   Financial Resource Strain:   . Difficulty of Paying Living Expenses:   Food Insecurity:   . Worried About Programme researcher, broadcasting/film/video in the Last Year:   . Barista in the Last Year:   Transportation Needs:   . Freight forwarder (Medical):   Marland Kitchen Lack of Transportation (Non-Medical):   Physical Activity:   . Days of Exercise per Week:   . Minutes of Exercise per Session:   Stress:   . Feeling of Stress :   Social Connections:   . Frequency of Communication with Friends and Family:   . Frequency of Social Gatherings with Friends and Family:   . Attends Religious Services:   . Active Member of Clubs or Organizations:   . Attends Banker Meetings:   Marland Kitchen Marital Status:   Intimate Partner Violence:   . Fear of Current or Ex-Partner:   . Emotionally Abused:   Marland Kitchen Physically Abused:   . Sexually Abused:     Outpatient Medications Prior to Visit  Medication Sig Dispense Refill  . aspirin-acetaminophen-caffeine (EXCEDRIN MIGRAINE) 250-250-65 MG tablet Take by mouth.    Marland Kitchen  Testosterone 20.25 MG/ACT (1.62%) GEL Apply 40.5 mg topically daily.    Marland Kitchen DEXILANT 60 MG capsule Take 1 capsule (60 mg total) by mouth daily. 90 capsule 1  . topiramate (TOPAMAX) 25 MG tablet Take 1 tablet (25 mg total) by mouth 2 (two) times daily. Week 1: 25 mg by mouth Hour of sleep, week 2: 25 mg po twice a day, week 3: 25 mg by mouth morning and 50 mg hour of sleep, Week 4: 50 mg po twice a day. 70 tablet 0   No facility-administered medications prior to visit.    Allergies  Allergen Reactions  . Penicillins Hives  . Cashew Nut Oil Hives  . Levetiracetam Other (See Comments)    ROS Review of Systems  Constitutional: Negative.   HENT: Negative.   Eyes: Negative.   Respiratory: Negative.   Cardiovascular: Negative.   Gastrointestinal: Positive  for constipation.  Endocrine: Negative.   Genitourinary: Negative.   Musculoskeletal: Positive for myalgias.  Skin: Negative.   Neurological: Negative.   Hematological: Negative.   Psychiatric/Behavioral: Negative.       Objective:    Physical Exam  Constitutional: He is oriented to person, place, and time. He appears well-developed and well-nourished.  HENT:  Head: Normocephalic and atraumatic.  Right Ear: External ear normal.  Left Ear: External ear normal.  Mouth/Throat: Oropharynx is clear and moist.  Eyes: Pupils are equal, round, and reactive to light. Conjunctivae and EOM are normal.  Neck:  Full ROM neck, negative Lhermitte.  No UE numbness or neurologic deficit.  Cardiovascular: Normal rate, regular rhythm, normal heart sounds and intact distal pulses.  Pulmonary/Chest: Effort normal and breath sounds normal.  Abdominal: Soft. Bowel sounds are normal.  Musculoskeletal:        General: Normal range of motion.     Cervical back: Normal range of motion and neck supple.  Neurological: He is alert and oriented to person, place, and time. He has normal reflexes.  Skin: Skin is warm.  Vitals reviewed.   BP 120/67 (BP Location: Left Arm, Patient Position: Sitting)   Pulse 70   Temp 98 F (36.7 C) (Temporal)   Resp 16   Ht 6\' 1"  (1.854 m)   Wt 157 lb (71.2 kg)   BMI 20.71 kg/m  Wt Readings from Last 3 Encounters:  10/22/19 157 lb (71.2 kg)  09/19/19 157 lb (71.2 kg)     Health Maintenance Due  Topic Date Due  . HIV Screening  Never done    There are no preventive care reminders to display for this patient.  Lab Results  Component Value Date   TSH 3.020 09/19/2019   Lab Results  Component Value Date   WBC 9.7 09/13/2010   HGB 14.5 09/13/2010   HCT 40.2 09/13/2010   MCV 85.9 09/13/2010   PLT 251 09/13/2010   Lab Results  Component Value Date   NA 139 09/13/2010   K 3.5 09/13/2010   CO2 28 09/13/2010   GLUCOSE 128 (H) 09/13/2010   BUN 15  09/13/2010   CREATININE 0.95 09/13/2010   BILITOT 0.7 09/13/2010   ALKPHOS 64 09/13/2010   AST 22 09/13/2010   ALT 19 09/13/2010   PROT 7.2 09/13/2010   ALBUMIN 4.6 09/13/2010   CALCIUM 9.5 09/13/2010   No results found for: CHOL No results found for: HDL No results found for: LDLCALC No results found for: TRIG No results found for: CHOLHDL No results found for: 09/15/2010    Assessment & Plan:  Problem List Items Addressed This Visit      Digestive   Gastroesophageal reflux disease without esophagitis    Plan of care was formulated today.  he is doing well.  A plan of care was formulated using patient exam, tests and other sources to optimize care using evidence based information.  Recommend no smoking, no eating after supper, avoid fatty foods, elevate Head of bed, avoid tight fitting clothing.  Continue on dexalant.      Relevant Medications   DEXILANT 60 MG capsule     Endocrine   Male hypogonadism - Primary    AN INDIVIDUAL CARE PLAN for hypogonadism was established and reinforced today.  The patient's status was assessed using clinical findings on exam, labs, and other diagnostic testing. Patient's success at meeting treatment goals based on disease specific evidence-bassed guidelines and found to be in good control. RECOMMENDATIONS include maintaining present medicines and treatment.        Other   Paresthesia of skin    Patient has bilateral shoulder muscle tension and paresthesias both arms.  I ordered NCS/EMG      Relevant Orders   Nerve conduction test   Hyperglycemia    Patient is having elevated bs WITH Paresthesias in arm, Check A1c.      Relevant Orders   Hemoglobin A1c   Depression, major, single episode, moderate ([place])    Patient's depression is uncontrolled with no medicines.   Anhedonia worse.  PHQ 9 was performed score 16. An individual care plan was established or reinforced today.  The patient's disease status was assessed using clinical findings  on exam, labs, and or other diagnostic testing to determine patient's success in meeting treatment goals based on disease specific evidence-based guidelines and found to be worsening Recommendations include start on fluoxetine 20mg  and ambien at night for sleep.      Relevant Medications   FLUoxetine (PROZAC) 20 MG tablet   zolpidem (AMBIEN) 10 MG tablet    Other Visit Diagnoses    Paresthesia and pain of both upper extremities       Relevant Orders   Nerve conduction test      Meds ordered this encounter  Medications  . FLUoxetine (PROZAC) 20 MG tablet    Sig: Take 1 tablet (20 mg total) by mouth daily.    Dispense:  30 tablet    Refill:  3  . zolpidem (AMBIEN) 10 MG tablet    Sig: Take 1 tablet (10 mg total) by mouth at bedtime as needed for sleep.    Dispense:  15 tablet    Refill:  1  . DEXILANT 60 MG capsule    Sig: Take 1 capsule (60 mg total) by mouth daily.    Dispense:  90 capsule    Refill:  2    Follow-up: Return in about 2 weeks (around 11/05/2019) for depression.    Reinaldo Meeker, MD

## 2019-10-22 NOTE — Patient Instructions (Signed)

## 2019-10-22 NOTE — Assessment & Plan Note (Signed)
AN INDIVIDUAL CARE PLAN for hypogonadism was established and reinforced today.  The patient's status was assessed using clinical findings on exam, labs, and other diagnostic testing. Patient's success at meeting treatment goals based on disease specific evidence-bassed guidelines and found to be in good control. RECOMMENDATIONS include maintaining present medicines and treatment.

## 2019-10-23 LAB — HEMOGLOBIN A1C
Est. average glucose Bld gHb Est-mCnc: 100 mg/dL
Hgb A1c MFr Bld: 5.1 % (ref 4.8–5.6)

## 2019-10-23 NOTE — Progress Notes (Signed)
A1c 5.1 normal no diabetes lp

## 2019-11-05 ENCOUNTER — Ambulatory Visit: Payer: BC Managed Care – PPO | Admitting: Legal Medicine

## 2019-11-05 ENCOUNTER — Encounter: Payer: Self-pay | Admitting: Legal Medicine

## 2019-11-05 ENCOUNTER — Other Ambulatory Visit: Payer: Self-pay

## 2019-11-05 VITALS — BP 118/74 | HR 64 | Temp 97.4°F | Resp 18 | Ht 73.0 in | Wt 153.0 lb

## 2019-11-05 DIAGNOSIS — F321 Major depressive disorder, single episode, moderate: Secondary | ICD-10-CM | POA: Diagnosis not present

## 2019-11-05 DIAGNOSIS — K219 Gastro-esophageal reflux disease without esophagitis: Secondary | ICD-10-CM | POA: Diagnosis not present

## 2019-11-05 NOTE — Assessment & Plan Note (Signed)
Patient's depression is partially with fluoxetine.   Anhedonia better.  PHQ 9 was performed  score 16. An individual care plan was established or reinforced today.  The patient's disease status was assessed using clinical findings on exam, labs, and or other diagnostic testing to determine patient's success in meeting treatment goals based on disease specific evidence-based guidelines and found to be slightly improving Recommendations include stay on medicine for one month and recheck

## 2019-11-05 NOTE — Progress Notes (Signed)
Established Patient Office Visit  Subjective:  Patient ID: Charles Weber, male    DOB: 1981/04/07  Age: 39 y.o. MRN: [id number]  CC:  Chief Complaint  Patient presents with  . Depression    HPI [name] presents for GERD and depression.  Patient has gastroesophageal reflux symptoms withesophagitis and LTRD.  The symptoms are moderate intensity.  Length of symptoms 5 years  years.  Medicines include dealant.  Complications include none  This patient has major depression for 6 months.  PHQ9 =16.  Patient is having less anhedonia.  The patient has improvement future plans and prospects.  The depression is worse with stress.  The patient is exercising and working on behavior to improve mental health.  Patient is not seeing a therapist or psychiatrist.  na  Patient is on fluoxetine...  Past Medical History:  Diagnosis Date  . Gastroparesis   . Migraine without aura, not intractable, without status migrainosus   . Pain in joints of right hand 10/22/2019  . Pain in left hand 10/22/2019  . Paresthesia of skin   . Slow transit constipation   . Testicular hypofunction     History reviewed. No pertinent surgical history.  Family History  Problem Relation Age of Onset  . Irritable bowel syndrome Mother   . Heart attack Father   . Breast cancer Maternal Grandmother     Social History   Socioeconomic History  . Marital status: Married    Spouse name: Not on file  . Number of children: 1  . Years of education: Not on file  . Highest education level: Not on file  Occupational History  . Not on file  Tobacco Use  . Smoking status: Never Smoker  . Smokeless tobacco: Never Used  Substance and Sexual Activity  . Alcohol use: Never  . Drug use: Never  . Sexual activity: Yes  Other Topics Concern  . Not on file  Social History Narrative  . Not on file   Social Determinants of Health   Financial Resource Strain:   . Difficulty of Paying Living Expenses:   Food Insecurity:    . Worried About Programme researcher, broadcasting/film/video in the Last Year:   . Barista in the Last Year:   Transportation Needs:   . Freight forwarder (Medical):   Marland Kitchen Lack of Transportation (Non-Medical):   Physical Activity:   . Days of Exercise per Week:   . Minutes of Exercise per Session:   Stress:   . Feeling of Stress :   Social Connections:   . Frequency of Communication with Friends and Family:   . Frequency of Social Gatherings with Friends and Family:   . Attends Religious Services:   . Active Member of Clubs or Organizations:   . Attends Banker Meetings:   Marland Kitchen Marital Status:   Intimate Partner Violence:   . Fear of Current or Ex-Partner:   . Emotionally Abused:   Marland Kitchen Physically Abused:   . Sexually Abused:     Outpatient Medications Prior to Visit  Medication Sig Dispense Refill  . aspirin-acetaminophen-caffeine (EXCEDRIN MIGRAINE) 250-250-65 MG tablet Take by mouth.    . DEXILANT 60 MG capsule Take 1 capsule (60 mg total) by mouth daily. 90 capsule 2  . FLUoxetine (PROZAC) 20 MG tablet Take 1 tablet (20 mg total) by mouth daily. 30 tablet 3  . Testosterone 20.25 MG/ACT (1.62%) GEL Apply 40.5 mg topically daily.    Marland Kitchen zolpidem (AMBIEN) 10 MG  tablet Take 1 tablet (10 mg total) by mouth at bedtime as needed for sleep. 15 tablet 1   No facility-administered medications prior to visit.    Allergies  Allergen Reactions  . Penicillins Hives  . Cashew Nut Oil Hives  . Levetiracetam Other (See Comments)    ROS Review of Systems  Constitutional: Negative.   HENT: Negative.   Eyes: Negative.   Respiratory: Negative.   Cardiovascular: Negative.   Endocrine: Negative.   Genitourinary: Negative.   Musculoskeletal: Negative.   Neurological: Negative.   Psychiatric/Behavioral: Positive for dysphoric mood.      Objective:    Physical Exam  Constitutional: He is oriented to person, place, and time. He appears well-developed.  HENT:  Head: Normocephalic and  atraumatic.  Right Ear: External ear normal.  Left Ear: External ear normal.  Nose: Nose normal.  Mouth/Throat: Oropharynx is clear and moist.  Eyes: Pupils are equal, round, and reactive to light. Conjunctivae and EOM are normal.  Cardiovascular: Normal rate, regular rhythm, normal heart sounds and intact distal pulses.  Pulmonary/Chest: Effort normal and breath sounds normal.  Abdominal: Soft. Bowel sounds are normal.  Musculoskeletal:        General: Normal range of motion.     Cervical back: Normal range of motion and neck supple.  Neurological: He is alert and oriented to person, place, and time. He has normal reflexes.  Skin: Skin is warm and dry.  Vitals reviewed.   BP 118/74   Pulse 64   Temp (!) 97.4 F (36.3 C)   Resp 18   Ht 6\' 1"  (1.854 m)   Wt 153 lb (69.4 kg)   SpO2 96%   BMI 20.19 kg/m  Wt Readings from Last 3 Encounters:  11/05/19 153 lb (69.4 kg)  10/22/19 157 lb (71.2 kg)  09/19/19 157 lb (71.2 kg)     Health Maintenance Due  Topic Date Due  . HIV Screening  Never done    There are no preventive care reminders to display for this patient.  Lab Results  Component Value Date   TSH 3.020 09/19/2019   Lab Results  Component Value Date   WBC 9.7 09/13/2010   HGB 14.5 09/13/2010   HCT 40.2 09/13/2010   MCV 85.9 09/13/2010   PLT 251 09/13/2010   Lab Results  Component Value Date   NA 139 09/13/2010   K 3.5 09/13/2010   CO2 28 09/13/2010   GLUCOSE 128 (H) 09/13/2010   BUN 15 09/13/2010   CREATININE 0.95 09/13/2010   BILITOT 0.7 09/13/2010   ALKPHOS 64 09/13/2010   AST 22 09/13/2010   ALT 19 09/13/2010   PROT 7.2 09/13/2010   ALBUMIN 4.6 09/13/2010   CALCIUM 9.5 09/13/2010   No results found for: CHOL No results found for: HDL No results found for: LDLCALC No results found for: TRIG No results found for: CHOLHDL Lab Results  Component Value Date   HGBA1C 5.1 10/22/2019      Assessment & Plan:   Problem List Items Addressed  This Visit      Digestive   Gastroesophageal reflux disease without esophagitis    Plan of care was formulated today.  She is doing well.  A plan of care was formulated using patient exam, tests and other sources to optimize care using evidence based information.  Recommend no smoking, no eating after supper, avoid fatty foods, elevate Head of bed, avoid tight fitting clothing.  Continue on dexilant.  Other   Depression, major, single episode, moderate ([place]) - Primary    Patient's depression is partially with fluoxetine.   Anhedonia better.  PHQ 9 was performed  score 16. An individual care plan was established or reinforced today.  The patient's disease status was assessed using clinical findings on exam, labs, and or other diagnostic testing to determine patient's success in meeting treatment goals based on disease specific evidence-based guidelines and found to be slightly improving Recommendations include stay on medicine for one month and recheck         No orders of the defined types were placed in this encounter.   Follow-up: Return in about 1 month (around 12/06/2019).    Reinaldo Meeker, MD

## 2019-11-05 NOTE — Assessment & Plan Note (Signed)
Plan of care was formulated today.  She is doing well.  A plan of care was formulated using patient exam, tests and other sources to optimize care using evidence based information.  Recommend no smoking, no eating after supper, avoid fatty foods, elevate Head of bed, avoid tight fitting clothing.  Continue on dexilant.

## 2019-12-07 ENCOUNTER — Ambulatory Visit: Payer: BC Managed Care – PPO | Admitting: Legal Medicine

## 2020-01-24 ENCOUNTER — Other Ambulatory Visit: Payer: Self-pay | Admitting: Gastroenterology

## 2020-01-24 DIAGNOSIS — R1011 Right upper quadrant pain: Secondary | ICD-10-CM

## 2020-02-01 ENCOUNTER — Ambulatory Visit
Admission: RE | Admit: 2020-02-01 | Discharge: 2020-02-01 | Disposition: A | Discharge status: Home / Self Care | Payer: BC Managed Care – PPO | Source: Ambulatory Visit | Attending: Gastroenterology | Admitting: Gastroenterology

## 2020-02-01 DIAGNOSIS — R1011 Right upper quadrant pain: Secondary | ICD-10-CM

## 2020-02-04 ENCOUNTER — Other Ambulatory Visit: Payer: Self-pay | Admitting: Legal Medicine

## 2020-02-11 ENCOUNTER — Ambulatory Visit: Payer: BC Managed Care – PPO | Admitting: Legal Medicine

## 2020-02-11 ENCOUNTER — Encounter: Payer: Self-pay | Admitting: Legal Medicine

## 2020-02-11 ENCOUNTER — Other Ambulatory Visit: Payer: Self-pay

## 2020-02-11 VITALS — BP 110/68 | HR 60 | Temp 97.8°F | Resp 16 | Ht 73.0 in | Wt 152.4 lb

## 2020-02-11 DIAGNOSIS — K760 Fatty (change of) liver, not elsewhere classified: Secondary | ICD-10-CM

## 2020-02-11 DIAGNOSIS — F321 Major depressive disorder, single episode, moderate: Secondary | ICD-10-CM

## 2020-02-11 NOTE — Progress Notes (Signed)
Subjective:  Patient ID: Charles Weber, male    DOB: 07/18/1980  Age: 39 y.o. MRN: [id number]  Chief Complaint  Patient presents with  . Depression  . Fatty Liver    HPI: Chronic visit  This patient has major depression for years.  PHQ9 =17.  Patient is having less anhedonia.  The patient has improved future plans and prospects.  The depression is worse with stress and stopping medicine.  The patient is not exercising and working on behavior to improve mental health.  Patient is not seeing a therapist or psychiatrist.  na  Patient is on no medicines. He is out of work for 2 years.  He needs professional counseling.  He has depression and probably Chronic Fatigue syndrome.  Work on the psyche first  Fatty liver: he was diagnosed with nonalcoholic steatosis and sees GI. Dr. Merri Brunette They want dietician to control lipids.  No symptoms  Low testosterone level but remains in 300s   Current Outpatient Medications on File Prior to Visit  Medication Sig Dispense Refill  . aspirin-acetaminophen-caffeine (EXCEDRIN MIGRAINE) 250-250-65 MG tablet Take by mouth.    . DEXILANT 60 MG capsule Take 1 capsule (60 mg total) by mouth daily. 90 capsule 2  . zolpidem (AMBIEN) 10 MG tablet Take 1 tablet (10 mg total) by mouth at bedtime as needed for sleep. 15 tablet 1   No current facility-administered medications on file prior to visit.   Past Medical History:  Diagnosis Date  . Gastroparesis   . Migraine without aura, not intractable, without status migrainosus   . Pain in joints of right hand 10/22/2019  . Pain in left hand 10/22/2019  . Paresthesia of skin   . Slow transit constipation   . Testicular hypofunction    History reviewed. No pertinent surgical history.  Family History  Problem Relation Age of Onset  . Irritable bowel syndrome Mother   . Heart attack Father   . Breast cancer Maternal Grandmother    Social History   Socioeconomic History  . Marital status: Married    Spouse name: Not  on file  . Number of children: 1  . Years of education: Not on file  . Highest education level: Not on file  Occupational History  . Not on file  Tobacco Use  . Smoking status: Never Smoker  . Smokeless tobacco: Never Used  Vaping Use  . Vaping Use: Never used  Substance and Sexual Activity  . Alcohol use: Never  . Drug use: Never  . Sexual activity: Yes  Other Topics Concern  . Not on file  Social History Narrative  . Not on file   Social Determinants of Health   Financial Resource Strain:   . Difficulty of Paying Living Expenses: Not on file  Food Insecurity:   . Worried About Programme researcher, broadcasting/film/video in the Last Year: Not on file  . Ran Out of Food in the Last Year: Not on file  Transportation Needs:   . Lack of Transportation (Medical): Not on file  . Lack of Transportation (Non-Medical): Not on file  Physical Activity:   . Days of Exercise per Week: Not on file  . Minutes of Exercise per Session: Not on file  Stress:   . Feeling of Stress : Not on file  Social Connections:   . Frequency of Communication with Friends and Family: Not on file  . Frequency of Social Gatherings with Friends and Family: Not on file  . Attends Religious Services: Not  on file  . Active Member of Clubs or Organizations: Not on file  . Attends Banker Meetings: Not on file  . Marital Status: Not on file    Review of Systems  Constitutional: Negative.   HENT: Negative.   Eyes: Negative.   Respiratory: Negative.   Cardiovascular: Negative.   Gastrointestinal: Negative.   Endocrine: Negative.   Genitourinary: Negative.   Musculoskeletal: Negative.   Skin: Negative.   Neurological: Negative.   Psychiatric/Behavioral: Negative.      Objective:  BP 110/68   Pulse 60   Temp 97.8 F (36.6 C)   Resp 16   Ht 6\' 1"  (1.854 m)   Wt 152 lb 6.4 oz (69.1 kg)   BMI 20.11 kg/m   BP/Weight 02/11/2020 11/05/2019 10/22/2019  Systolic BP 110 118 120  Diastolic BP 68 74 67  Wt. (Lbs)  152.4 153 157  BMI 20.11 20.19 20.71    Physical Exam Vitals reviewed.  Constitutional:      Appearance: Normal appearance.  HENT:     Head: Normocephalic and atraumatic.     Right Ear: Tympanic membrane normal.     Left Ear: Tympanic membrane normal.     Nose: Nose normal.     Mouth/Throat:     Mouth: Mucous membranes are moist.     Pharynx: Oropharynx is clear.  Eyes:     Extraocular Movements: Extraocular movements intact.     Conjunctiva/sclera: Conjunctivae normal.     Pupils: Pupils are equal, round, and reactive to light.  Cardiovascular:     Rate and Rhythm: Normal rate and regular rhythm.     Pulses: Normal pulses.     Heart sounds: Normal heart sounds.  Pulmonary:     Effort: Pulmonary effort is normal.     Breath sounds: Normal breath sounds.  Abdominal:     General: Abdomen is flat. Bowel sounds are normal.  Musculoskeletal:        General: Normal range of motion.     Cervical back: Normal range of motion and neck supple.  Skin:    General: Skin is warm and dry.     Capillary Refill: Capillary refill takes less than 2 seconds.  Neurological:     General: No focal deficit present.     Mental Status: He is alert and oriented to person, place, and time.  Psychiatric:        Mood and Affect: Mood normal.        Thought Content: Thought content normal.    Depression screen [facility] 2/9 02/11/2020 11/05/2019 10/24/2019 09/19/2019  Decreased Interest 3 2 1 3   Down, Depressed, Hopeless 2 2 2 3   PHQ - 2 Score 5 4 3 6   Altered sleeping 3 2 2 3   Tired, decreased energy 3 3 3 3   Change in appetite 3 2 2 1   Feeling bad or failure about yourself  2 3 2 1   Trouble concentrating 1 2 2 2   Moving slowly or fidgety/restless 0 0 1 0  Suicidal thoughts 0 0 0 0  PHQ-9 Score 17 16 15 16   Difficult doing work/chores Somewhat difficult - - Extremely dIfficult      Lab Results  Component Value Date   WBC 9.7 09/13/2010   HGB 14.5 09/13/2010   HCT 40.2 09/13/2010   PLT 251  09/13/2010   GLUCOSE 128 (H) 09/13/2010   ALT 19 09/13/2010   AST 22 09/13/2010   NA 139 09/13/2010   K 3.5 09/13/2010  CL 101 09/13/2010   CREATININE 0.95 09/13/2010   BUN 15 09/13/2010   CO2 28 09/13/2010   TSH 3.020 09/19/2019   INR 1.03 09/13/2010   HGBA1C 5.1 10/22/2019      Assessment & Plan:   1. Depression, major, single episode, moderate ([facility]) Patient's depression is uncontrolled with stopped fluoxetine.   Anhedonia worse.  PHQ 9 was performed score 17. An individual care plan was established or reinforced today.  The patient's disease status was assessed using clinical findings on exam, labs, and or other diagnostic testing to determine patient's success in meeting treatment goals based on disease specific evidence-based guidelines and found to be worsening.  He also has chronic fatigue and is deconditioned.  I will refer to psychiatry for assistance since his depression is recalcitrant for medicines tried.  Ay need MAO or stimulant. Recommendations include stop medicines  2. Fatty liver - Amb ref to Medical Nutrition Therapy-MNT Patient has fatty liver and followed by GI.  We will refer to dietitian.  The only treatment seems to be weight loss and new semaglutide ( [facility]JM).  I do not know if his elasticity has been checked.     Orders Placed This Encounter  Procedures  . Amb ref to Medical Nutrition Therapy-MNT  . Ambulatory referral to Psychiatry     Follow-up: Return in about 6 months (around 08/13/2020) for fasting.  An After Visit Summary was printed and given to the patient.  Brent Bulla [facility] [phone]

## 2020-06-09 ENCOUNTER — Telehealth: Payer: Self-pay

## 2020-06-09 NOTE — Telephone Encounter (Signed)
Notice of approval from [facility] [employer] was received. Prescription was approved for the following time period: 06/06/2020 to 06/06/2021.

## 2020-08-20 ENCOUNTER — Ambulatory Visit: Payer: BC Managed Care – PPO | Admitting: Legal Medicine

## 2020-09-25 ENCOUNTER — Encounter: Payer: Self-pay | Admitting: Legal Medicine

## 2020-11-05 ENCOUNTER — Other Ambulatory Visit: Payer: Self-pay

## 2020-11-05 MED ORDER — DEXLANSOPRAZOLE 60 MG PO CPDR: 60.0000 mg | DELAYED_RELEASE_CAPSULE | Freq: Every day | ORAL | 2 refills | Status: AC

## 2020-12-15 ENCOUNTER — Telehealth: Payer: Self-pay

## 2020-12-15 NOTE — Telephone Encounter (Signed)
[facility] [employer] denied the request of coverage of Dexilant cap 60mg   because the plan does not cover the requested medication.

## 2022-08-04 ENCOUNTER — Telehealth: Payer: Self-pay

## 2022-08-04 NOTE — Telephone Encounter (Signed)
Patient is overdue for a chronic f.up fasting appointment. I left a message stating that the patient is overdue for an appointment. Waiting for the patient to call the office back.
